# Patient Record
Sex: Male | Born: 1960 | Race: White | Hispanic: No | Marital: Married | Smoking: Never smoker
Health system: Southern US, Community
[De-identification: ages and names within clinical notes are randomized; demographics above are authoritative.]

## PROBLEM LIST (undated history)

## (undated) DIAGNOSIS — I10 Essential (primary) hypertension: Secondary | ICD-10-CM

## (undated) DIAGNOSIS — E785 Hyperlipidemia, unspecified: Secondary | ICD-10-CM

## (undated) DIAGNOSIS — N529 Male erectile dysfunction, unspecified: Secondary | ICD-10-CM

## (undated) HISTORY — DX: Essential (primary) hypertension: I10

## (undated) HISTORY — DX: Male erectile dysfunction, unspecified: N52.9

---

## 2010-09-02 HISTORY — PX: HERNIA REPAIR: SHX51

## 2011-05-23 ENCOUNTER — Ambulatory Visit: Payer: Self-pay | Admitting: General Surgery

## 2015-10-05 ENCOUNTER — Other Ambulatory Visit: Payer: Self-pay | Admitting: Family Medicine

## 2015-10-09 ENCOUNTER — Encounter: Payer: Self-pay | Admitting: Family Medicine

## 2017-01-06 DIAGNOSIS — N529 Male erectile dysfunction, unspecified: Secondary | ICD-10-CM | POA: Insufficient documentation

## 2017-01-06 DIAGNOSIS — I10 Essential (primary) hypertension: Secondary | ICD-10-CM | POA: Insufficient documentation

## 2017-01-09 ENCOUNTER — Telehealth: Payer: Self-pay

## 2017-01-09 ENCOUNTER — Ambulatory Visit (INDEPENDENT_AMBULATORY_CARE_PROVIDER_SITE_OTHER): Payer: BLUE CROSS/BLUE SHIELD | Admitting: Family Medicine

## 2017-01-09 ENCOUNTER — Encounter: Payer: Self-pay | Admitting: Family Medicine

## 2017-01-09 VITALS — BP 159/106 | HR 81 | Temp 98.3°F | Ht 66.0 in | Wt 230.0 lb

## 2017-01-09 DIAGNOSIS — I10 Essential (primary) hypertension: Secondary | ICD-10-CM

## 2017-01-09 DIAGNOSIS — J309 Allergic rhinitis, unspecified: Secondary | ICD-10-CM

## 2017-01-09 DIAGNOSIS — Z1211 Encounter for screening for malignant neoplasm of colon: Secondary | ICD-10-CM | POA: Diagnosis not present

## 2017-01-09 DIAGNOSIS — Z131 Encounter for screening for diabetes mellitus: Secondary | ICD-10-CM | POA: Diagnosis not present

## 2017-01-09 DIAGNOSIS — Z0000 Encounter for general adult medical examination without abnormal findings: Secondary | ICD-10-CM | POA: Diagnosis not present

## 2017-01-09 DIAGNOSIS — Z13220 Encounter for screening for lipoid disorders: Secondary | ICD-10-CM | POA: Diagnosis not present

## 2017-01-09 DIAGNOSIS — Z1329 Encounter for screening for other suspected endocrine disorder: Secondary | ICD-10-CM

## 2017-01-09 DIAGNOSIS — N529 Male erectile dysfunction, unspecified: Secondary | ICD-10-CM

## 2017-01-09 LAB — UA/M W/RFLX CULTURE, ROUTINE
Bilirubin, UA: NEGATIVE
Glucose, UA: NEGATIVE
Ketones, UA: NEGATIVE
LEUKOCYTES UA: NEGATIVE
Nitrite, UA: NEGATIVE
PH UA: 7 (ref 5.0–7.5)
PROTEIN UA: NEGATIVE
Specific Gravity, UA: 1.01 (ref 1.005–1.030)
Urobilinogen, Ur: 0.2 mg/dL (ref 0.2–1.0)

## 2017-01-09 LAB — HEMOCCULT GUIAC POC 1CARD (OFFICE): Fecal Occult Blood, POC: NEGATIVE

## 2017-01-09 MED ORDER — TADALAFIL 5 MG PO TABS: 5.0000 mg | ORAL_TABLET | Freq: Every day | ORAL | 6 refills | Status: AC | PRN

## 2017-01-09 MED ORDER — TADALAFIL 10 MG PO TABS: 10.0000 mg | ORAL_TABLET | Freq: Every day | ORAL | 6 refills | Status: DC | PRN

## 2017-01-09 MED ORDER — ALBUTEROL SULFATE HFA 108 (90 BASE) MCG/ACT IN AERS: 2.0000 | INHALATION_SPRAY | Freq: Four times a day (QID) | RESPIRATORY_TRACT | 1 refills | Status: AC | PRN

## 2017-01-09 MED ORDER — LISINOPRIL 10 MG PO TABS: 10.0000 mg | ORAL_TABLET | Freq: Every day | ORAL | 3 refills | Status: AC

## 2017-01-09 NOTE — Progress Notes (Signed)
BP (!) 159/106 (BP Location: Left Arm)   Pulse 81   Temp 98.3 F (36.8 C)   Ht 5\' 6"  (1.676 m)   Wt 230 lb (104.3 kg)   SpO2 96%   BMI 37.12 kg/m    Subjective:    Patient ID: Seth Price, male    DOB: [birth year], 56 y.o.   MRN: [id number]  HPI: Seth PoliteJohn R Bala is a 56 y.o. male presenting on 01/09/2017 for comprehensive medical examination. Current medical complaints include:wanting to discuss getting back on ED medication. Previously has been on cialis and sildenafil with good results.    Keep albuterol inhaler on hand for occasional SOB during summer allergies with exertion. Always relieved with inhaler use, and uses infrequently.   Has not been treated for HTN for many years. Had a bad experience with the medication he was previously on, states it made him feel very anxious and out of sorts. Does not recall what medication he was on previously. Has access to home monitor but does not check. Denies CP, HAs, dizziness.   Interim Problems from his last visit: no  Depression Screen done today and results listed below:  [facility]  Decreased Interest 0  Down, Depressed, Hopeless 0  PHQ - 2 Score 0    The patient does not have a history of falls. I did not complete a risk assessment for falls. A plan of care for falls was not documented.   Past Medical History:  Past Medical History:  Diagnosis Date  . ED (erectile dysfunction)   . Hypertension     Surgical History:  Past Surgical History:  Procedure Laterality Date  . HERNIA REPAIR  2012    Medications:  No current outpatient prescriptions on file prior to visit.   No current facility-administered medications on file prior to visit.     Allergies:  No Known Allergies  Social History:  Social History   Social History  . Marital status: Married    Spouse name: N/A  . Number of children: N/A  . Years of education: N/A   Occupational History  . Not on file.   Social History  Main Topics  . Smoking status: Never Smoker  . Smokeless tobacco: Never Used  . Alcohol use Yes     Comment: 2-3 times a week  . Drug use: No  . Sexual activity: Not on file   Other Topics Concern  . Not on file   Social History Narrative  . No narrative on file   History  Smoking Status  . Never Smoker  Smokeless Tobacco  . Never Used   History  Alcohol Use  . Yes    Comment: 2-3 times a week    Family History:  Family History  Problem Relation Age of Onset  . Diabetes Mother   . Heart disease Mother   . Cancer Paternal Grandfather   . COPD Neg Hx   . Stroke Neg Hx     Past medical history, surgical history, medications, allergies, family history and social history reviewed with patient today and changes made to appropriate areas of the chart.   Review of Systems - General ROS: negative Psychological ROS: negative Ophthalmic ROS: negative ENT ROS: negative Respiratory ROS: positive for intermittent SOB during allergy seasons Cardiovascular ROS: negative Gastrointestinal ROS: negative Genito-Urinary ROS: positive for - erectile dysfunction Musculoskeletal ROS: negative Neurological ROS: no TIA or stroke symptoms Dermatological ROS: negative All other ROS negative except what is  listed above and in the HPI.      Objective:    BP (!) 159/106 (BP Location: Left Arm)   Pulse 81   Temp 98.3 F (36.8 C)   Ht 5\' 6"  (1.676 m)   Wt 230 lb (104.3 kg)   SpO2 96%   BMI 37.12 kg/m   Wt Readings from Last 3 Encounters:  01/09/17 230 lb (104.3 kg)    Physical Exam  Constitutional: He is oriented to person, place, and time. He appears well-developed and well-nourished.  HENT:  Head: Atraumatic.  Right Ear: External ear normal.  Left Ear: External ear normal.  Nose: Nose normal.  Mouth/Throat: Oropharynx is clear and moist. No oropharyngeal exudate.  Eyes: Conjunctivae and EOM are normal. Pupils are equal, round, and reactive to light.  Neck: Normal range of  motion. Neck supple. No thyromegaly present.  No carotid bruits noted  Cardiovascular: Normal rate, regular rhythm and normal heart sounds.   Pulmonary/Chest: Effort normal and breath sounds normal. No respiratory distress.  Abdominal: Soft. Bowel sounds are normal. He exhibits no mass. There is no tenderness.  Genitourinary: Prostate normal.  Musculoskeletal: Normal range of motion. He exhibits no edema, tenderness or deformity.  Lymphadenopathy:    He has no cervical adenopathy.  Neurological: He is alert and oriented to person, place, and time. He displays normal reflexes. No cranial nerve deficit. Coordination normal.  Skin: Skin is warm and dry. No rash noted.  Psychiatric: He has a normal mood and affect. His behavior is normal.  Nursing note and vitals reviewed.   Results for orders placed or performed in visit on 01/09/17  UA/M w/rflx Culture, Routine  Result Value Ref Range   Specific Gravity, UA 1.010 1.005 - 1.030   pH, UA 7.0 5.0 - 7.5   Color, UA Yellow Yellow   Appearance Ur Clear Clear   Leukocytes, UA Negative Negative   Protein, UA Negative Negative/Trace   Glucose, UA Negative Negative   Ketones, UA Negative Negative   RBC, UA Trace (A) Negative   Bilirubin, UA Negative Negative   Urobilinogen, Ur 0.2 0.2 - 1.0 mg/dL   Nitrite, UA Negative Negative  POCT Occult Blood Stool  Result Value Ref Range   Fecal Occult Blood, POC Negative Negative   Card #1 Date     Card #2 Fecal Occult Blod, POC     Card #2 Date     Card #3 Fecal Occult Blood, POC     Card #3 Date        Assessment & Plan:   Problem List Items Addressed This Visit      Cardiovascular and Mediastinum   Hypertension    Not at goal. Long discussion regarding DASH diet, exercise, and weight loss. Will start 10 mg lisinopril and check back in 1 month. Has BP monitor at home, will check 1-2x weekly and record readings to bring to next appt. Risks and benefits discussed with medication       Relevant Medications   tadalafil (CIALIS) 10 MG tablet   lisinopril (PRINIVIL,ZESTRIL) 10 MG tablet   Other Relevant Orders   CBC with Differential/Platelet   Comprehensive metabolic panel   UA/M w/rflx Culture, Routine (Completed)     Genitourinary   ED (erectile dysfunction)    Will restart cialis. Discussed precautions including alerting emergency medical personnel and avoiding nitrates.        Other Visit Diagnoses    Annual physical exam    -  Primary  Await fasting labs. Colonoscopy ordered. UTD on vaccines.    Relevant Orders   POCT Occult Blood Stool (Completed)   Screening for colon cancer       Relevant Orders   Ambulatory referral to Gastroenterology   POCT Occult Blood Stool (Completed)   Screening for diabetes mellitus       Relevant Orders   HgB A1c   Screening for hyperlipidemia       Relevant Orders   Lipid Panel w/o Chol/HDL Ratio   Screening for thyroid disorder       Relevant Orders   TSH   Allergic rhinitis, unspecified seasonality, unspecified trigger       Albuterol inhaler sent for prn use. Discussed getting on regular PO allergy tabs to help keep intermittent allergy sxs at bay.        Discussed aspirin prophylaxis for myocardial infarction prevention and decision was it was not indicated  LABORATORY TESTING:  Health maintenance labs ordered today as discussed above.   The natural history of prostate cancer and ongoing controversy regarding screening and potential treatment outcomes of prostate cancer has been discussed with the patient. The meaning of a false positive PSA and a false negative PSA has been discussed. He indicates understanding of the limitations of this screening test and wishes not to proceed with screening PSA testing.   IMMUNIZATIONS:   - Tdap: Tetanus vaccination status reviewed: last tetanus booster within 10 years. - Influenza: Up to date  SCREENING: - Colonoscopy: Ordered today  Discussed with patient purpose of the  colonoscopy is to detect colon cancer at curable precancerous or early stages   PATIENT COUNSELING:    Sexuality: Discussed sexually transmitted diseases, partner selection, use of condoms, avoidance of unintended pregnancy  and contraceptive alternatives.   Advised to avoid cigarette smoking.  I discussed with the patient that most people either abstain from alcohol or drink within safe limits (<=14/week and <=4 drinks/occasion for males, <=7/weeks and <= 3 drinks/occasion for females) and that the risk for alcohol disorders and other health effects rises proportionally with the number of drinks per week and how often a drinker exceeds daily limits.  Discussed cessation/primary prevention of drug use and availability of treatment for abuse.   Diet: Encouraged to adjust caloric intake to maintain  or achieve ideal body weight, to reduce intake of dietary saturated fat and total fat, to limit sodium intake by avoiding high sodium foods and not adding table salt, and to maintain adequate dietary potassium and calcium preferably from fresh fruits, vegetables, and low-fat dairy products.    stressed the importance of regular exercise  Injury prevention: Discussed safety belts, safety helmets, smoke detector, smoking near bedding or upholstery.   Dental health: Discussed importance of regular tooth brushing, flossing, and dental visits.   Follow up plan: NEXT PREVENTATIVE PHYSICAL DUE IN 1 YEAR. Return in about 4 weeks (around 02/06/2017) for BP.

## 2017-01-09 NOTE — Assessment & Plan Note (Signed)
Not at goal. Long discussion regarding DASH diet, exercise, and weight loss. Will start 10 mg lisinopril and check back in 1 month. Has BP monitor at home, will check 1-2x weekly and record readings to bring to next appt. Risks and benefits discussed with medication

## 2017-01-09 NOTE — Assessment & Plan Note (Signed)
Will restart cialis. Discussed precautions including alerting emergency medical personnel and avoiding nitrates.

## 2017-01-09 NOTE — Telephone Encounter (Signed)
Patient wants the Cialis to be changed to 5mg , Pharmacist states the 10mg  is much more expensive.

## 2017-01-09 NOTE — Telephone Encounter (Signed)
[facility]

## 2017-01-10 ENCOUNTER — Telehealth: Payer: Self-pay | Admitting: Family Medicine

## 2017-01-10 LAB — CBC WITH DIFFERENTIAL/PLATELET
BASOS: 1 %
Basophils Absolute: 0 10*3/uL (ref 0.0–0.2)
EOS (ABSOLUTE): 0.1 10*3/uL (ref 0.0–0.4)
EOS: 2 %
Hematocrit: 45.7 % (ref 37.5–51.0)
Hemoglobin: 15.5 g/dL (ref 13.0–17.7)
IMMATURE GRANS (ABS): 0 10*3/uL (ref 0.0–0.1)
IMMATURE GRANULOCYTES: 1 %
LYMPHS: 21 %
Lymphocytes Absolute: 1.3 10*3/uL (ref 0.7–3.1)
MCH: 30.9 pg (ref 26.6–33.0)
MCHC: 33.9 g/dL (ref 31.5–35.7)
MCV: 91 fL (ref 79–97)
Monocytes Absolute: 0.6 10*3/uL (ref 0.1–0.9)
Monocytes: 9 %
NEUTROS PCT: 66 %
Neutrophils Absolute: 4.2 10*3/uL (ref 1.4–7.0)
PLATELETS: 275 10*3/uL (ref 150–379)
RBC: 5.01 x10E6/uL (ref 4.14–5.80)
RDW: 13.7 % (ref 12.3–15.4)
WBC: 6.4 10*3/uL (ref 3.4–10.8)

## 2017-01-10 LAB — COMPREHENSIVE METABOLIC PANEL
A/G RATIO: 1.6 (ref 1.2–2.2)
ALT: 19 IU/L (ref 0–44)
AST: 15 IU/L (ref 0–40)
Albumin: 4.3 g/dL (ref 3.5–5.5)
Alkaline Phosphatase: 81 IU/L (ref 39–117)
BUN/Creatinine Ratio: 18 (ref 9–20)
BUN: 14 mg/dL (ref 6–24)
Bilirubin Total: 0.2 mg/dL (ref 0.0–1.2)
CALCIUM: 9.5 mg/dL (ref 8.7–10.2)
CO2: 22 mmol/L (ref 18–29)
Chloride: 102 mmol/L (ref 96–106)
Creatinine, Ser: 0.76 mg/dL (ref 0.76–1.27)
GFR calc non Af Amer: 103 mL/min/{1.73_m2} (ref >59)
GFR, EST AFRICAN AMERICAN: 119 mL/min/{1.73_m2} (ref >59)
Globulin, Total: 2.7 g/dL (ref 1.5–4.5)
Glucose: 102 mg/dL — ABNORMAL HIGH (ref 65–99)
POTASSIUM: 4.5 mmol/L (ref 3.5–5.2)
Sodium: 140 mmol/L (ref 134–144)
TOTAL PROTEIN: 7 g/dL (ref 6.0–8.5)

## 2017-01-10 LAB — LIPID PANEL W/O CHOL/HDL RATIO
Cholesterol, Total: 202 mg/dL — ABNORMAL HIGH (ref 100–199)
HDL: 51 mg/dL (ref >39)
LDL Calculated: 122 mg/dL — ABNORMAL HIGH (ref 0–99)
TRIGLYCERIDES: 143 mg/dL (ref 0–149)
VLDL Cholesterol Cal: 29 mg/dL (ref 5–40)

## 2017-01-10 LAB — HEMOGLOBIN A1C
Est. average glucose Bld gHb Est-mCnc: 103 mg/dL
HEMOGLOBIN A1C: 5.2 % (ref 4.8–5.6)

## 2017-01-10 LAB — TSH: TSH: 1.33 u[IU]/mL (ref 0.450–4.500)

## 2017-01-10 MED ORDER — ROSUVASTATIN CALCIUM 10 MG PO TABS: 10.0000 mg | ORAL_TABLET | Freq: Every day | ORAL | 1 refills | Status: AC

## 2017-01-10 NOTE — Telephone Encounter (Signed)
Please call pt and let him know that his labs look good other than his cholesterol is high. I am recommending we start a cholesterol medicine to help with his risk - ASCVD score 9.4% 10 yr risk. I am sending one over for him to start. Weight loss, diet, exercise can help with this as well and is highly recommended. Will recheck fasting lipid at follow up

## 2017-01-10 NOTE — Telephone Encounter (Signed)
No answer, no voicemail box set up.

## 2017-01-13 NOTE — Telephone Encounter (Signed)
Patient notified of lab results

## 2017-01-29 ENCOUNTER — Encounter: Payer: Self-pay | Admitting: Family Medicine

## 2017-01-30 ENCOUNTER — Other Ambulatory Visit: Payer: Self-pay | Admitting: Family Medicine

## 2017-02-06 ENCOUNTER — Ambulatory Visit (INDEPENDENT_AMBULATORY_CARE_PROVIDER_SITE_OTHER): Payer: BLUE CROSS/BLUE SHIELD | Admitting: Family Medicine

## 2017-02-06 ENCOUNTER — Encounter: Payer: Self-pay | Admitting: Family Medicine

## 2017-02-06 VITALS — BP 145/95 | HR 76 | Temp 98.6°F | Wt 233.0 lb

## 2017-02-06 DIAGNOSIS — E782 Mixed hyperlipidemia: Secondary | ICD-10-CM

## 2017-02-06 DIAGNOSIS — N529 Male erectile dysfunction, unspecified: Secondary | ICD-10-CM

## 2017-02-06 DIAGNOSIS — E785 Hyperlipidemia, unspecified: Secondary | ICD-10-CM | POA: Insufficient documentation

## 2017-02-06 DIAGNOSIS — I10 Essential (primary) hypertension: Secondary | ICD-10-CM | POA: Diagnosis not present

## 2017-02-06 NOTE — Assessment & Plan Note (Signed)
Encouraged pt to go ahead and start the crestor. Will have him come back for f/u 1 month after starting the medication. Reiterated risks with the medication. F/u with any concerns

## 2017-02-06 NOTE — Progress Notes (Signed)
BP (!) 145/95   Pulse 76   Temp 98.6 F (37 C)   Wt 233 lb (105.7 kg)   SpO2 97%   BMI 37.61 kg/m    Subjective:    Patient ID: Seth Price, male    DOB: 1960-12-20, 56 y.o.   MRN: [id number]  HPI: Seth Price is a 56 y.o. male  Chief Complaint  Patient presents with  . Hypertension    He did not start the Lisinopril or the Crestor.    Patient presents today for 1 month f/u on his BP, ED, and cholesterol. Was given cialis, crestor, and lisinopril at previous visit. Pt states he picked all of his medications up but has not yet started any of them. No reason why, just hadn't gotten around to it just yet. No questions or concerns regarding the medications. Plans to start taking them all today. Denies CP, SOB, palpitations, claudication.   Relevant past medical, surgical, family and social history reviewed and updated as indicated. Interim medical history since our last visit reviewed. Allergies and medications reviewed and updated.  Review of Systems  Constitutional: Negative.   HENT: Negative.   Respiratory: Negative.   Cardiovascular: Negative.   Gastrointestinal: Negative.   Genitourinary: Negative.   Musculoskeletal: Negative.   Neurological: Negative.   Psychiatric/Behavioral: Negative.     Per HPI unless specifically indicated above     Objective:    BP (!) 145/95   Pulse 76   Temp 98.6 F (37 C)   Wt 233 lb (105.7 kg)   SpO2 97%   BMI 37.61 kg/m   Wt Readings from Last 3 Encounters:  02/06/17 233 lb (105.7 kg)  01/09/17 230 lb (104.3 kg)    Physical Exam  Constitutional: He is oriented to person, place, and time. He appears well-developed and well-nourished. No distress.  HENT:  Head: Atraumatic.  Eyes: Conjunctivae are normal. Pupils are equal, round, and reactive to light.  Neck: Normal range of motion. Neck supple.  Cardiovascular: Normal rate and normal heart sounds.   Pulmonary/Chest: Effort normal and breath sounds normal. No  respiratory distress.  Musculoskeletal: Normal range of motion.  Neurological: He is alert and oriented to person, place, and time.  Skin: Skin is warm and dry.  Psychiatric: He has a normal mood and affect. His behavior is normal.  Nursing note and vitals reviewed.   Results for orders placed or performed in visit on 01/09/17  CBC with Differential/Platelet  Result Value Ref Range   WBC 6.4 3.4 - 10.8 x10E3/uL   RBC 5.01 4.14 - 5.80 x10E6/uL   Hemoglobin 15.5 13.0 - 17.7 g/dL   Hematocrit 78.245.7 95.637.5 - 51.0 %   MCV 91 79 - 97 fL   MCH 30.9 26.6 - 33.0 pg   MCHC 33.9 31.5 - 35.7 g/dL   RDW 21.313.7 08.612.3 - 57.815.4 %   Platelets 275 150 - 379 x10E3/uL   Neutrophils 66 Not Estab. %   Lymphs 21 Not Estab. %   Monocytes 9 Not Estab. %   Eos 2 Not Estab. %   Basos 1 Not Estab. %   Neutrophils Absolute 4.2 1.4 - 7.0 x10E3/uL   Lymphocytes Absolute 1.3 0.7 - 3.1 x10E3/uL   Monocytes Absolute 0.6 0.1 - 0.9 x10E3/uL   EOS (ABSOLUTE) 0.1 0.0 - 0.4 x10E3/uL   Basophils Absolute 0.0 0.0 - 0.2 x10E3/uL   Immature Granulocytes 1 Not Estab. %   Immature Grans (Abs) 0.0 0.0 - 0.1 x10E3/uL  Comprehensive metabolic panel  Result Value Ref Range   Glucose 102 (H) 65 - 99 mg/dL   BUN 14 6 - 24 mg/dL   Creatinine, Ser 1.61 0.76 - 1.27 mg/dL   GFR calc non Af Amer 103 >59 mL/min/1.73   GFR calc Af Amer 119 >59 mL/min/1.73   BUN/Creatinine Ratio 18 9 - 20   Sodium 140 134 - 144 mmol/L   Potassium 4.5 3.5 - 5.2 mmol/L   Chloride 102 96 - 106 mmol/L   CO2 22 18 - 29 mmol/L   Calcium 9.5 8.7 - 10.2 mg/dL   Total Protein 7.0 6.0 - 8.5 g/dL   Albumin 4.3 3.5 - 5.5 g/dL   Globulin, Total 2.7 1.5 - 4.5 g/dL   Albumin/Globulin Ratio 1.6 1.2 - 2.2   Bilirubin Total 0.2 0.0 - 1.2 mg/dL   Alkaline Phosphatase 81 39 - 117 IU/L   AST 15 0 - 40 IU/L   ALT 19 0 - 44 IU/L  Lipid Panel w/o Chol/HDL Ratio  Result Value Ref Range   Cholesterol, Total 202 (H) 100 - 199 mg/dL   Triglycerides 096 0 - 149 mg/dL    HDL 51 >04 mg/dL   VLDL Cholesterol Cal 29 5 - 40 mg/dL   LDL Calculated 540 (H) 0 - 99 mg/dL  TSH  Result Value Ref Range   TSH 1.330 0.450 - 4.500 uIU/mL  UA/M w/rflx Culture, Routine  Result Value Ref Range   Specific Gravity, UA 1.010 1.005 - 1.030   pH, UA 7.0 5.0 - 7.5   Color, UA Yellow Yellow   Appearance Ur Clear Clear   Leukocytes, UA Negative Negative   Protein, UA Negative Negative/Trace   Glucose, UA Negative Negative   Ketones, UA Negative Negative   RBC, UA Trace (A) Negative   Bilirubin, UA Negative Negative   Urobilinogen, Ur 0.2 0.2 - 1.0 mg/dL   Nitrite, UA Negative Negative  HgB A1c  Result Value Ref Range   Hgb A1c MFr Bld 5.2 4.8 - 5.6 %   Est. average glucose Bld gHb Est-mCnc 103 mg/dL  POCT Occult Blood Stool  Result Value Ref Range   Fecal Occult Blood, POC Negative Negative   Card #1 Date     Card #2 Fecal Occult Blod, POC     Card #2 Date     Card #3 Fecal Occult Blood, POC     Card #3 Date        Assessment & Plan:   Problem List Items Addressed This Visit      Cardiovascular and Mediastinum   Hypertension - Primary    Encouraged pt to start taking lisinopril, will follow up 1 month from initiating medication. Reiterated importance of DASH diet, exercise, weight loss.         Genitourinary   ED (erectile dysfunction)    Has not started the medication yet. Will start taking and f/u with any issues.         Other   Hyperlipidemia    Encouraged pt to go ahead and start the crestor. Will have him come back for f/u 1 month after starting the medication. Reiterated risks with the medication. F/u with any concerns          Follow up plan: Return in about 4 weeks (around 03/06/2017) for BP, Cholesterol.

## 2017-02-06 NOTE — Assessment & Plan Note (Signed)
Encouraged pt to start taking lisinopril, will follow up 1 month from initiating medication. Reiterated importance of DASH diet, exercise, weight loss.

## 2017-02-06 NOTE — Assessment & Plan Note (Signed)
Has not started the medication yet. Will start taking and f/u with any issues.

## 2017-02-07 ENCOUNTER — Other Ambulatory Visit: Payer: Self-pay

## 2017-02-07 ENCOUNTER — Telehealth: Payer: Self-pay

## 2017-02-07 ENCOUNTER — Encounter: Payer: Self-pay | Admitting: *Deleted

## 2017-02-07 DIAGNOSIS — Z1211 Encounter for screening for malignant neoplasm of colon: Secondary | ICD-10-CM

## 2017-02-07 NOTE — Telephone Encounter (Signed)
Gastroenterology Pre-Procedure Review  Request Date: 7/12 Requesting Physician: Dr. Servando SnareWohl  PATIENT REVIEW QUESTIONS: The patient responded to the following health history questions as indicated:    1. Are you having any GI issues? no 2. Do you have a personal history of Polyps? no 3. Do you have a family history of Colon Cancer or Polyps? no 4. Diabetes Mellitus? no 5. Joint replacements in the past 12 months?no 6. Major health problems in the past 3 months?no 7. Any artificial heart valves, MVP, or defibrillator?no    MEDICATIONS & ALLERGIES:    Patient reports the following regarding taking any anticoagulation/antiplatelet therapy:   Plavix, Coumadin, Eliquis, Xarelto, Lovenox, Pradaxa, Brilinta, or Effient? no Aspirin? no  Patient confirms/reports the following medications:  Current Outpatient Prescriptions  Medication Sig Dispense Refill  . albuterol (PROVENTIL HFA;VENTOLIN HFA) 108 (90 Base) MCG/ACT inhaler Inhale 2 puffs into the lungs every 6 (six) hours as needed for wheezing or shortness of breath. (Patient not taking: Reported on 02/06/2017) 1 Inhaler 1  . lisinopril (PRINIVIL,ZESTRIL) 10 MG tablet Take 1 tablet (10 mg total) by mouth daily. (Patient not taking: Reported on 02/06/2017) 90 tablet 3  . rosuvastatin (CRESTOR) 10 MG tablet Take 1 tablet (10 mg total) by mouth daily. (Patient not taking: Reported on 02/06/2017) 90 tablet 1  . tadalafil (CIALIS) 5 MG tablet Take 1 tablet (5 mg total) by mouth daily as needed for erectile dysfunction. (Patient not taking: Reported on 02/06/2017) 30 tablet 6   No current facility-administered medications for this visit.     Patient confirms/reports the following allergies:  No Known Allergies  No orders of the defined types were placed in this encounter.   AUTHORIZATION INFORMATION Primary Insurance: 1D#: Group #:  Secondary Insurance: 1D#: Group #:  SCHEDULE INFORMATION: Date: 7/12 Time: Location: [facility]

## 2017-03-04 ENCOUNTER — Encounter: Payer: Self-pay | Admitting: *Deleted

## 2017-03-06 ENCOUNTER — Ambulatory Visit: Payer: BLUE CROSS/BLUE SHIELD | Admitting: Family Medicine

## 2017-03-07 ENCOUNTER — Encounter: Payer: Self-pay | Admitting: Anesthesiology

## 2017-03-11 ENCOUNTER — Telehealth: Payer: Self-pay | Admitting: Gastroenterology

## 2017-03-11 NOTE — Telephone Encounter (Signed)
Seth Price and needs to cancel his procedure for 03/13/17 due to a family emergency out of town.

## 2017-03-12 NOTE — Telephone Encounter (Signed)
LVM for pt to return my call.

## 2017-03-13 ENCOUNTER — Encounter: Payer: Self-pay | Admitting: Family Medicine

## 2017-03-13 ENCOUNTER — Other Ambulatory Visit: Payer: Self-pay

## 2017-03-13 ENCOUNTER — Ambulatory Visit (INDEPENDENT_AMBULATORY_CARE_PROVIDER_SITE_OTHER): Payer: BLUE CROSS/BLUE SHIELD | Admitting: Family Medicine

## 2017-03-13 ENCOUNTER — Ambulatory Visit: Admit: 2017-03-13 | Payer: BLUE CROSS/BLUE SHIELD | Admitting: Gastroenterology

## 2017-03-13 VITALS — BP 126/84 | HR 78 | Temp 98.6°F | Wt 229.0 lb

## 2017-03-13 DIAGNOSIS — E782 Mixed hyperlipidemia: Secondary | ICD-10-CM | POA: Diagnosis not present

## 2017-03-13 DIAGNOSIS — I10 Essential (primary) hypertension: Secondary | ICD-10-CM | POA: Diagnosis not present

## 2017-03-13 HISTORY — DX: Hyperlipidemia, unspecified: E78.5

## 2017-03-13 SURGERY — COLONOSCOPY WITH PROPOFOL
Anesthesia: Choice

## 2017-03-13 NOTE — Progress Notes (Signed)
BP 126/84   Pulse 78   Temp 98.6 F (37 C)   Wt 229 lb (103.9 kg)   SpO2 97%   BMI 36.96 kg/m    Subjective:    Patient ID: Seth Price, male    DOB: [birth year], 56 y.o.   MRN: [id number]  HPI: [name] is a 56 y.o. male  Chief Complaint  Patient presents with  . Hyperlipidemia    follow up, no issues with the med  . Hypertension    follow up, no issues with the med   Patient presents for 1 month follow up since starting his BP and cholesterol medications. Doing well with both, taking faithfully without any side effects. Not checking home BPs. Denies CP, SOB, palpitations, leg cramps, fatigue. Has been making some significant lifestyle changes. Started Sunday with low carb, no beer. And trying to exercise more. Has had success with this in the past.   Relevant past medical, surgical, family and social history reviewed and updated as indicated. Interim medical history since our last visit reviewed. Allergies and medications reviewed and updated.  Review of Systems  Constitutional: Negative.   Respiratory: Negative.   Cardiovascular: Negative.   Gastrointestinal: Negative.   Musculoskeletal: Negative.   Neurological: Negative.   Psychiatric/Behavioral: Negative.    Per HPI unless specifically indicated above     Objective:    BP 126/84   Pulse 78   Temp 98.6 F (37 C)   Wt 229 lb (103.9 kg)   SpO2 97%   BMI 36.96 kg/m   Wt Readings from Last 3 Encounters:  03/13/17 229 lb (103.9 kg)  02/06/17 233 lb (105.7 kg)  01/09/17 230 lb (104.3 kg)    Physical Exam  Constitutional: He is oriented to person, place, and time. He appears well-developed and well-nourished. No distress.  HENT:  Head: Atraumatic.  Eyes: Pupils are equal, round, and reactive to light. Conjunctivae are normal.  Neck: Normal range of motion. Neck supple.  Cardiovascular: Normal rate and normal heart sounds.   Pulmonary/Chest: Effort normal and breath sounds normal. No respiratory  distress.  Musculoskeletal: Normal range of motion.  Neurological: He is alert and oriented to person, place, and time.  Skin: Skin is warm and dry.  Psychiatric: He has a normal mood and affect. His behavior is normal.  Nursing note and vitals reviewed.  Results for orders placed or performed in visit on 03/13/17  Comprehensive metabolic panel  Result Value Ref Range   Glucose 84 65 - 99 mg/dL   BUN 17 6 - 24 mg/dL   Creatinine, Ser 9.56 0.76 - 1.27 mg/dL   GFR calc non Af Amer 98 >59 mL/min/1.73   GFR calc Af Amer 113 >59 mL/min/1.73   BUN/Creatinine Ratio 20 9 - 20   Sodium 140 134 - 144 mmol/L   Potassium 4.5 3.5 - 5.2 mmol/L   Chloride 100 96 - 106 mmol/L   CO2 21 20 - 29 mmol/L   Calcium 9.1 8.7 - 10.2 mg/dL   Total Protein 7.2 6.0 - 8.5 g/dL   Albumin 4.6 3.5 - 5.5 g/dL   Globulin, Total 2.6 1.5 - 4.5 g/dL   Albumin/Globulin Ratio 1.8 1.2 - 2.2   Bilirubin Total 0.4 0.0 - 1.2 mg/dL   Alkaline Phosphatase 71 39 - 117 IU/L   AST 18 0 - 40 IU/L   ALT 17 0 - 44 IU/L  Lipid Panel w/o Chol/HDL Ratio  Result Value Ref Range   Cholesterol,  Total 158 100 - 199 mg/dL   Triglycerides 478110 0 - 149 mg/dL   HDL 41 >29>39 mg/dL   VLDL Cholesterol Cal 22 5 - 40 mg/dL   LDL Calculated 95 0 - 99 mg/dL      Assessment & Plan:   Problem List Items Addressed This Visit      Cardiovascular and Mediastinum   Hypertension    BPs at goal with 10 mg lisinopril. Continue current regimen and lifestyle modifications.         Other   Hyperlipidemia - Primary    Continue crestor and lifestyle modifications, await lab results      Relevant Orders   Comprehensive metabolic panel (Completed)   Lipid Panel w/o Chol/HDL Ratio (Completed)       Follow up plan: Return in about 6 months (around 09/13/2017) for BP, Chol f/u.

## 2017-03-14 LAB — COMPREHENSIVE METABOLIC PANEL
ALT: 17 IU/L (ref 0–44)
AST: 18 IU/L (ref 0–40)
Albumin/Globulin Ratio: 1.8 (ref 1.2–2.2)
Albumin: 4.6 g/dL (ref 3.5–5.5)
Alkaline Phosphatase: 71 IU/L (ref 39–117)
BUN/Creatinine Ratio: 20 (ref 9–20)
BUN: 17 mg/dL (ref 6–24)
Bilirubin Total: 0.4 mg/dL (ref 0.0–1.2)
CALCIUM: 9.1 mg/dL (ref 8.7–10.2)
CO2: 21 mmol/L (ref 20–29)
Chloride: 100 mmol/L (ref 96–106)
Creatinine, Ser: 0.85 mg/dL (ref 0.76–1.27)
GFR, EST AFRICAN AMERICAN: 113 mL/min/{1.73_m2} (ref >59)
GFR, EST NON AFRICAN AMERICAN: 98 mL/min/{1.73_m2} (ref >59)
GLUCOSE: 84 mg/dL (ref 65–99)
Globulin, Total: 2.6 g/dL (ref 1.5–4.5)
Potassium: 4.5 mmol/L (ref 3.5–5.2)
Sodium: 140 mmol/L (ref 134–144)
TOTAL PROTEIN: 7.2 g/dL (ref 6.0–8.5)

## 2017-03-14 LAB — LIPID PANEL W/O CHOL/HDL RATIO
Cholesterol, Total: 158 mg/dL (ref 100–199)
HDL: 41 mg/dL (ref >39)
LDL Calculated: 95 mg/dL (ref 0–99)
TRIGLYCERIDES: 110 mg/dL (ref 0–149)
VLDL CHOLESTEROL CAL: 22 mg/dL (ref 5–40)

## 2017-03-14 NOTE — Assessment & Plan Note (Signed)
BPs at goal with 10 mg lisinopril. Continue current regimen and lifestyle modifications.

## 2017-03-14 NOTE — Assessment & Plan Note (Signed)
Continue crestor and lifestyle modifications, await lab results

## 2017-06-09 ENCOUNTER — Ambulatory Visit (INDEPENDENT_AMBULATORY_CARE_PROVIDER_SITE_OTHER): Payer: BLUE CROSS/BLUE SHIELD

## 2017-06-09 ENCOUNTER — Encounter: Payer: Self-pay | Admitting: *Deleted

## 2017-06-09 ENCOUNTER — Ambulatory Visit
Admission: EM | Admit: 2017-06-09 | Discharge: 2017-06-09 | Disposition: A | Discharge status: Home / Self Care | Payer: BLUE CROSS/BLUE SHIELD | Attending: Family Medicine | Admitting: Family Medicine

## 2017-06-09 DIAGNOSIS — M79672 Pain in left foot: Secondary | ICD-10-CM

## 2017-06-09 DIAGNOSIS — S8262XA Displaced fracture of lateral malleolus of left fibula, initial encounter for closed fracture: Secondary | ICD-10-CM

## 2017-06-09 DIAGNOSIS — M25472 Effusion, left ankle: Secondary | ICD-10-CM | POA: Diagnosis not present

## 2017-06-09 MED ORDER — TRAMADOL HCL 50 MG PO TABS: 50.0000 mg | ORAL_TABLET | Freq: Three times a day (TID) | ORAL | 0 refills | Status: AC | PRN

## 2017-06-09 NOTE — ED Triage Notes (Signed)
Pt states he felt and twisted left ankle. C/O pain at level 8/10. Unable to walk on that foot

## 2017-06-09 NOTE — Discharge Instructions (Signed)
See [facility] later this week.  Rest, Ice, Elevate.  Take care  Dr. Adriana Simas

## 2017-06-09 NOTE — ED Provider Notes (Signed)
[facility]    CSN: [id number] Arrival date & time: 06/09/17  1718  History   Chief Complaint Chief Complaint  Patient presents with  . Ankle Pain   HPI  56 year old male presents with left ankle pain.  Patient states that he was coming out of the house today and twisted his ankle. He subsequently developed severe lateral ankle/foot pain and was unable to ambulate/bear weight. He reports associated swelling. Pain is severe. No medications or interventions tried. Exacerbated by weightbearing. No relieving factors. No other associated symptoms. No other complaints or concerns at this time.  Past Medical History:  Diagnosis Date  . ED (erectile dysfunction)   . Hyperlipidemia   . Hypertension    Patient Active Problem List   Diagnosis Date Noted  . Hyperlipidemia 02/06/2017  . ED (erectile dysfunction)   . Hypertension    Past Surgical History:  Procedure Laterality Date  . HERNIA REPAIR  2012    Home Medications    Prior to Admission medications   Medication Sig Start Date End Date Taking? Authorizing Provider  albuterol (PROVENTIL HFA;VENTOLIN HFA) 108 (90 Base) MCG/ACT inhaler Inhale 2 puffs into the lungs every 6 (six) hours as needed for wheezing or shortness of breath. 01/09/17  Yes Particia Nearing, PA-C  lisinopril (PRINIVIL,ZESTRIL) 10 MG tablet Take 1 tablet (10 mg total) by mouth daily. 01/09/17   Particia Nearing, PA-C  rosuvastatin (CRESTOR) 10 MG tablet Take 1 tablet (10 mg total) by mouth daily. 01/10/17   Particia Nearing, PA-C  tadalafil (CIALIS) 5 MG tablet Take 1 tablet (5 mg total) by mouth daily as needed for erectile dysfunction. 01/09/17   Particia Nearing, PA-C  traMADol (ULTRAM) 50 MG tablet Take 1 tablet (50 mg total) by mouth every 8 (eight) hours as needed. 06/09/17   Tommie Sams, DO   Family History Family History  Problem Relation Age of Onset  . Diabetes Mother   . Heart disease Mother   . Cancer Paternal  Grandfather   . COPD Neg Hx   . Stroke Neg Hx    Social History Social History  Substance Use Topics  . Smoking status: Never Smoker  . Smokeless tobacco: Never Used  . Alcohol use 14.4 oz/week    24 Cans of beer per week     Comment:     Allergies   Patient has no known allergies.   Review of Systems Review of Systems  Musculoskeletal:       Left ankle/foot pain, swelling.  All other systems reviewed and are negative.  Physical Exam Triage Vital Signs ED Triage Vitals  Enc Vitals Group     BP 06/09/17 1736 (!) 159/100     Pulse Rate 06/09/17 1736 90     Resp 06/09/17 1736 12     Temp 06/09/17 1736 98.2 F (36.8 C)     Temp Source 06/09/17 1736 Oral     SpO2 06/09/17 1736 97 %     Weight 06/09/17 1739 220 lb (99.8 kg)     Height 06/09/17 1739  (1.702 m)     Head Circumference --      Peak Flow --      Pain Score 06/09/17 1733 8     Pain Loc --      Pain Edu? --      Excl. in GC? --    Updated Vital Signs BP (!) 159/100 (BP Location: Left Arm)   Pulse 90  Temp 98.2 F (36.8 C) (Oral)   Resp 12   Ht  (1.702 m)   Wt 220 lb (99.8 kg)   SpO2 97%   BMI 34.46 kg/m   Physical Exam  Constitutional: He is oriented to person, place, and time. He appears well-developed. No distress.  HENT:  Head: Normocephalic and atraumatic.  Eyes: Conjunctivae are normal. No scleral icterus.  Neck: Normal range of motion.  Cardiovascular: Normal rate and regular rhythm.   No murmur heard. Pulmonary/Chest: Effort normal and breath sounds normal. He has no wheezes. He has no rales.  Abdominal: Soft. He exhibits no distension. There is no tenderness.  Musculoskeletal:  Ankle: Left  Inspection: Swelling noted at the lateral malleolar region. Range of motion decreased in inversion and eversion. Patient with discrete tenderness at the fifth metatarsal and at the lateral malleolus. Mild bruising noted on the dorsal surface.   Neurological: He is alert and oriented to  person, place, and time.  Skin: Skin is warm. No rash noted.  Psychiatric: He has a normal mood and affect.  Vitals reviewed.  UC Treatments / Results  Labs (all labs ordered are listed, but only abnormal results are displayed) Labs Reviewed - No data to display  EKG  EKG Interpretation None       Radiology Dg Ankle Complete Left  Result Date: 06/09/2017 CLINICAL DATA:  Twisting ankle injury, ankle pain. EXAM: LEFT ANKLE COMPLETE - 3+ VIEW COMPARISON:  None. FINDINGS: Linear calcification below the lateral malleolus, suspicious for an avulsion along the anterior talofibular ligament. Spurring on the medial malleolus and adjacent medial talus. Plafond and talar dome intact. Plantar and Achilles calcaneal spurs. Tibiotalar joint effusion. IMPRESSION: 1. Suspected avulsion fracture along the inferior margin of the lateral malleolus, potentially along the attachment site of the anterior talofibular ligament. 2. Medial tibiotalar spurring. 3. Tibiotalar joint effusion. 4. Plantar and Achilles calcaneal spurs. Electronically Signed   By: Gaylyn Rong M.D.   On: 06/09/2017 18:28   Dg Foot Complete Left  Result Date: 06/09/2017 CLINICAL DATA:  Twisting ankle injury falling.  Lateral pain. EXAM: LEFT FOOT - COMPLETE 3+ VIEW COMPARISON:  None. FINDINGS: Mild bony deformity distally along the fifth metatarsal compatible with an old healed injury or sessile osteochondroma. Metatarsals and phalanges otherwise unremarkable. No malalignment at the Lisfranc joint. Small type 1 accessory navicular. Plantar and Achilles calcaneal spurs. Tibiotalar joint effusion. IMPRESSION: 1. No fracture of the forefoot or midfoot is identified. 2. Type 1 accessory navicular. 3. Plantar and Achilles calcaneal spurs. 4. Mild deformity of the distal fifth metatarsal potentially from an old healed fracture or sessile osteochondroma. Electronically Signed   By: Gaylyn Rong M.D.   On: 06/09/2017 18:26    Procedures Procedures (including critical care time)  Medications Ordered in UC Medications - No data to display  Initial Impression / Assessment and Plan / UC Course  I have reviewed the triage vital signs and the nursing notes.  Pertinent labs & imaging results that were available during my care of the patient were reviewed by me and considered in my medical decision making (see chart for details).    56 year old male presents with an inversion injury. X-ray obtained given examination. Avulsion fracture noted. Placed in boot and given crutches as he still had difficulty ambulating with the boot. Tramadol as needed for pain. Advised to see [facility].  Final Clinical Impressions(s) / UC Diagnoses   Final diagnoses:  Closed avulsion fracture of lateral malleolus of left fibula,  initial encounter   New Prescriptions New Prescriptions   TRAMADOL (ULTRAM) 50 MG TABLET    Take 1 tablet (50 mg total) by mouth every 8 (eight) hours as needed.   Controlled Substance Prescriptions [place] Controlled Substance Registry consulted? I consulted the [place] registry. I could not identify/find the patient. Proceeding with a prescription for tramadol.    Tommie Sams, [place] 06/09/17 [phone]

## 2017-06-10 DIAGNOSIS — S8262XA Displaced fracture of lateral malleolus of left fibula, initial encounter for closed fracture: Secondary | ICD-10-CM | POA: Diagnosis not present

## 2017-09-19 ENCOUNTER — Ambulatory Visit: Payer: BLUE CROSS/BLUE SHIELD | Admitting: Family Medicine

## 2017-12-03 ENCOUNTER — Other Ambulatory Visit: Payer: Self-pay | Admitting: Family Medicine

## 2017-12-03 NOTE — Telephone Encounter (Signed)
Sildenafil refill request

## 2019-05-12 IMAGING — CR DG FOOT COMPLETE 3+V*L*
3 series · 3 of 3 positions shown · non-contrast
Comparison: None.

CLINICAL DATA: Twisting ankle injury falling.  Lateral pain.

EXAM:
LEFT FOOT - COMPLETE 3+ VIEW

[foot ap]
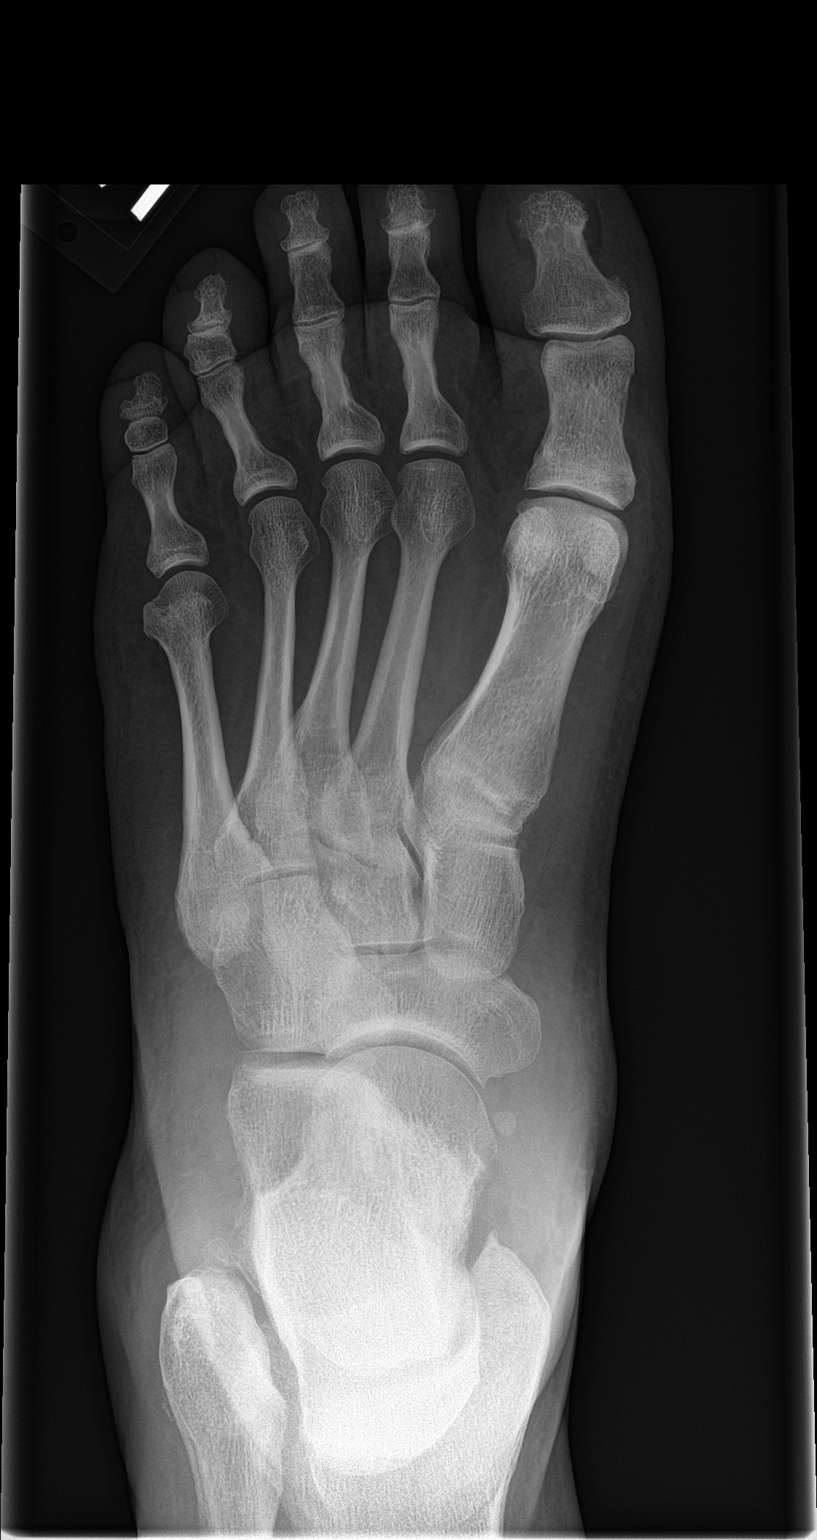

[foot obl]
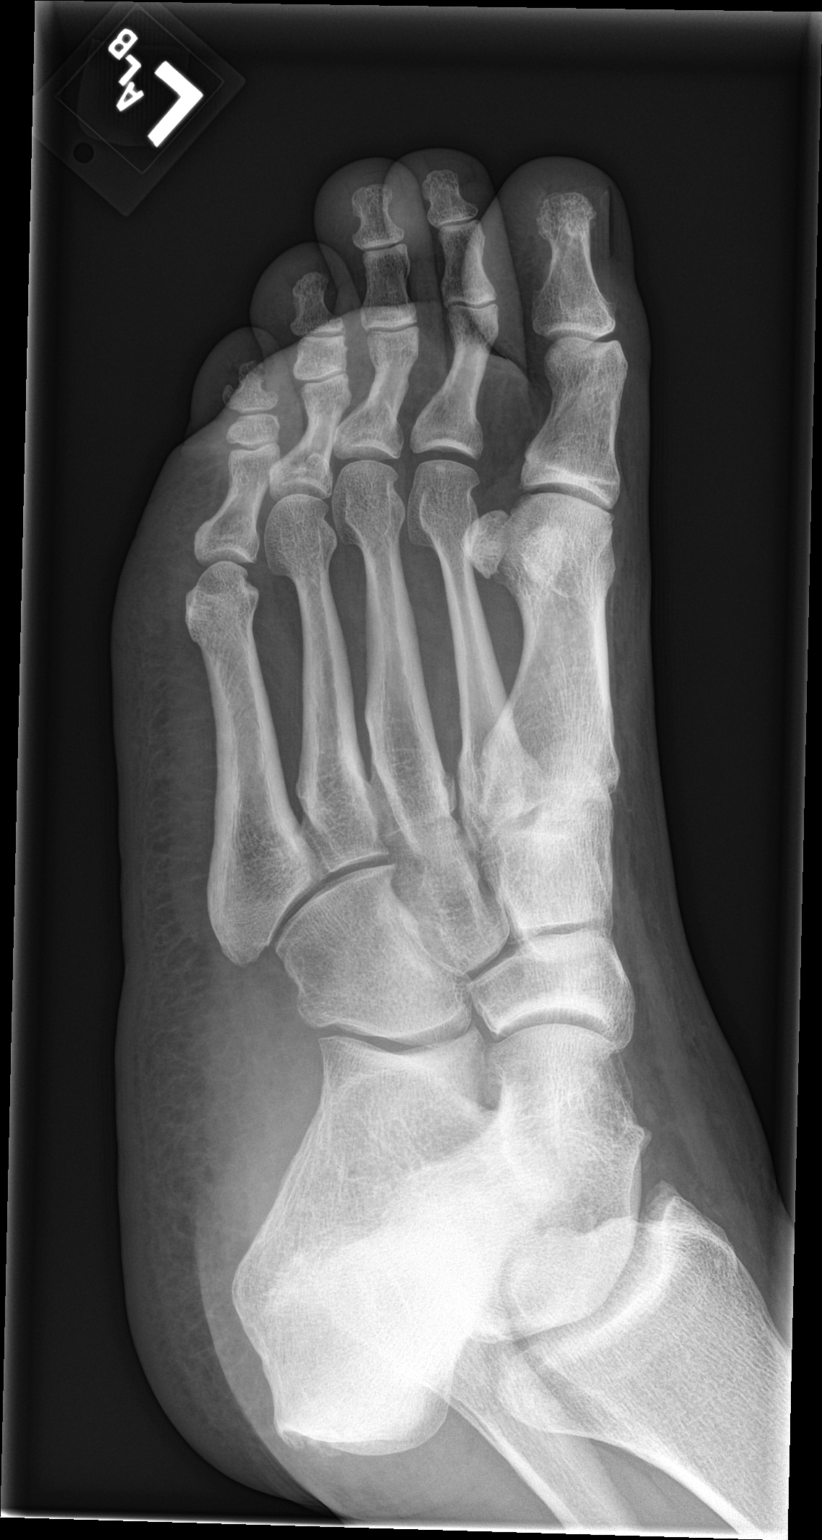

[foot lat]
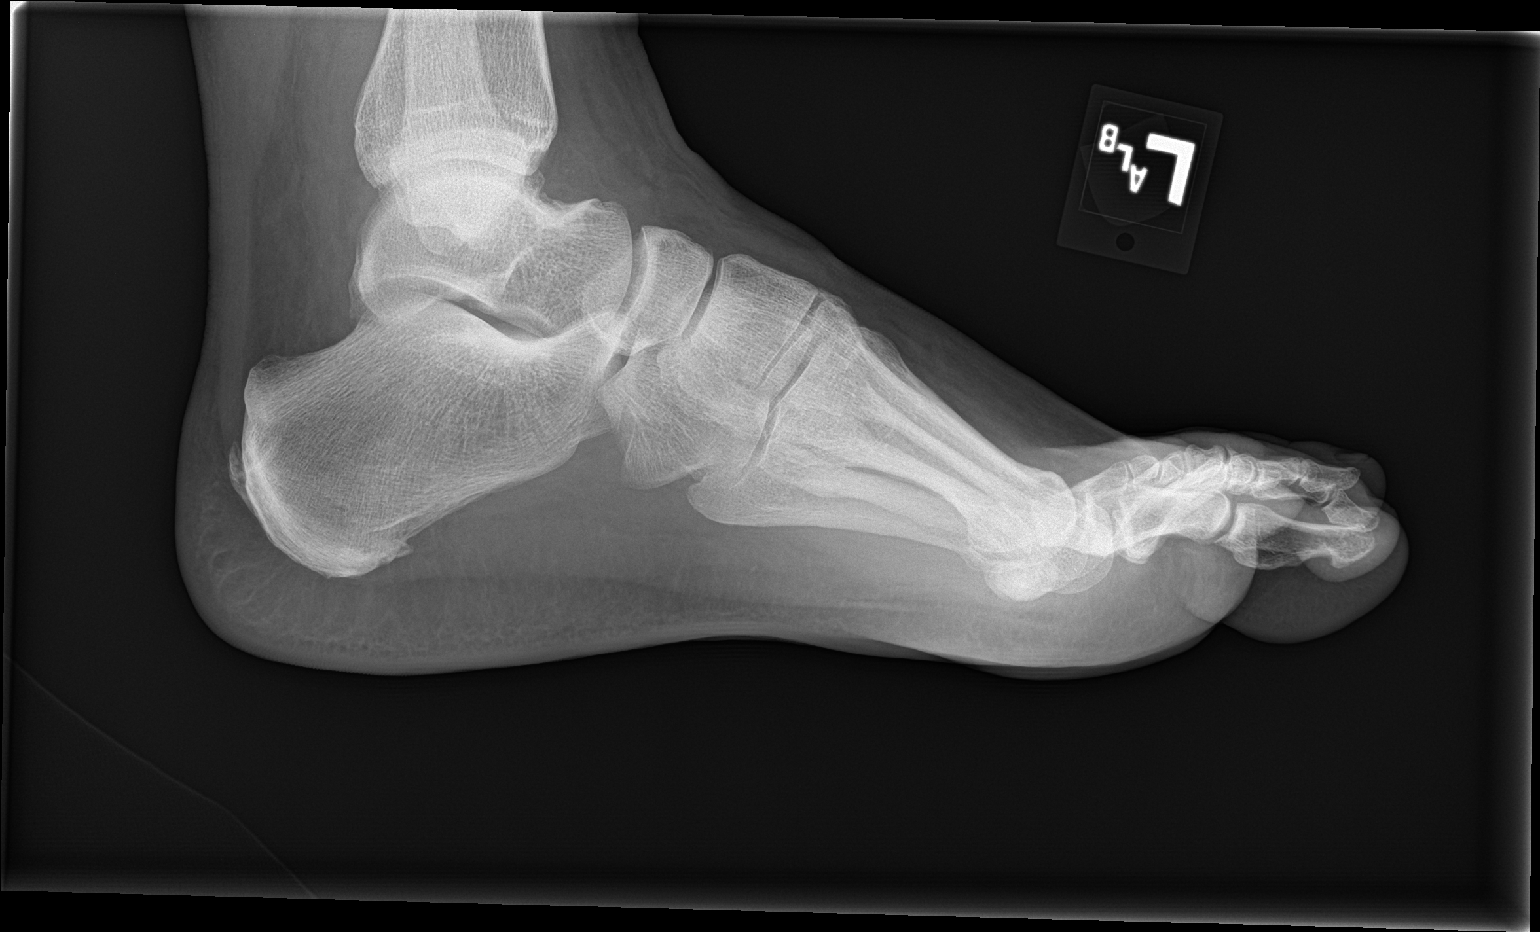

[3 of 3 positions shown; findings below may reference images not displayed]

FINDINGS: Mild bony deformity distally along the fifth metatarsal compatible
with an old healed injury or sessile osteochondroma. Metatarsals and
phalanges otherwise unremarkable. No malalignment at the Lisfranc
joint.

Small type 1 accessory navicular. Plantar and Achilles calcaneal
spurs. Tibiotalar joint effusion.
IMPRESSION: 1. No fracture of the forefoot or midfoot is identified.
2. Type 1 accessory navicular.
3. Plantar and Achilles calcaneal spurs.
4. Mild deformity of the distal fifth metatarsal potentially from an
old healed fracture or sessile osteochondroma.
